# Patient Record
Sex: Female | Born: 1985 | Race: White | Hispanic: No | Marital: Single | Smoking: Never smoker
Health system: Southern US, Community
[De-identification: ages and names within clinical notes are randomized; demographics above are authoritative.]

## PROBLEM LIST (undated history)

## (undated) HISTORY — PX: CHOLECYSTECTOMY: SHX55

## (undated) HISTORY — PX: GASTRIC ROUX-EN-Y: SHX5262

## (undated) HISTORY — PX: TONSILLECTOMY: SHX5217

---
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
## 2007-02-05 IMAGING — RF DG CHOLANGIOGRAM OPERATIVE
1 series · 4 of 4 positions shown · non-contrast
Comparison: none

CLINICAL DATA: Cholelithiasis.  
 INTRAOPERATIVE CHOLANGIOGRAM:
TECHNIQUE: Multiple fluoroscopic spot radiographs were obtained during intraoperative cholangiogram, and are submitted for interpretation post-operatively.

[Series 1: run · 4 of 78 frames shown]
[frame 2/78]
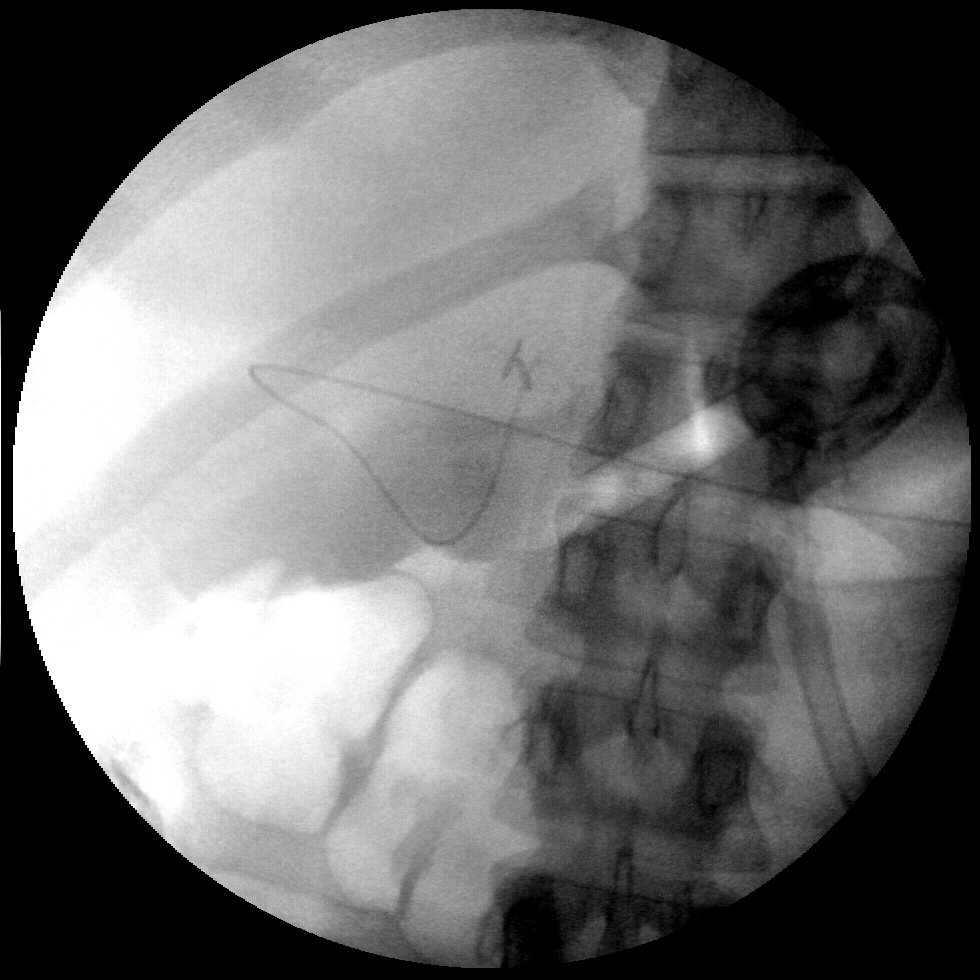
[frame 12/78]
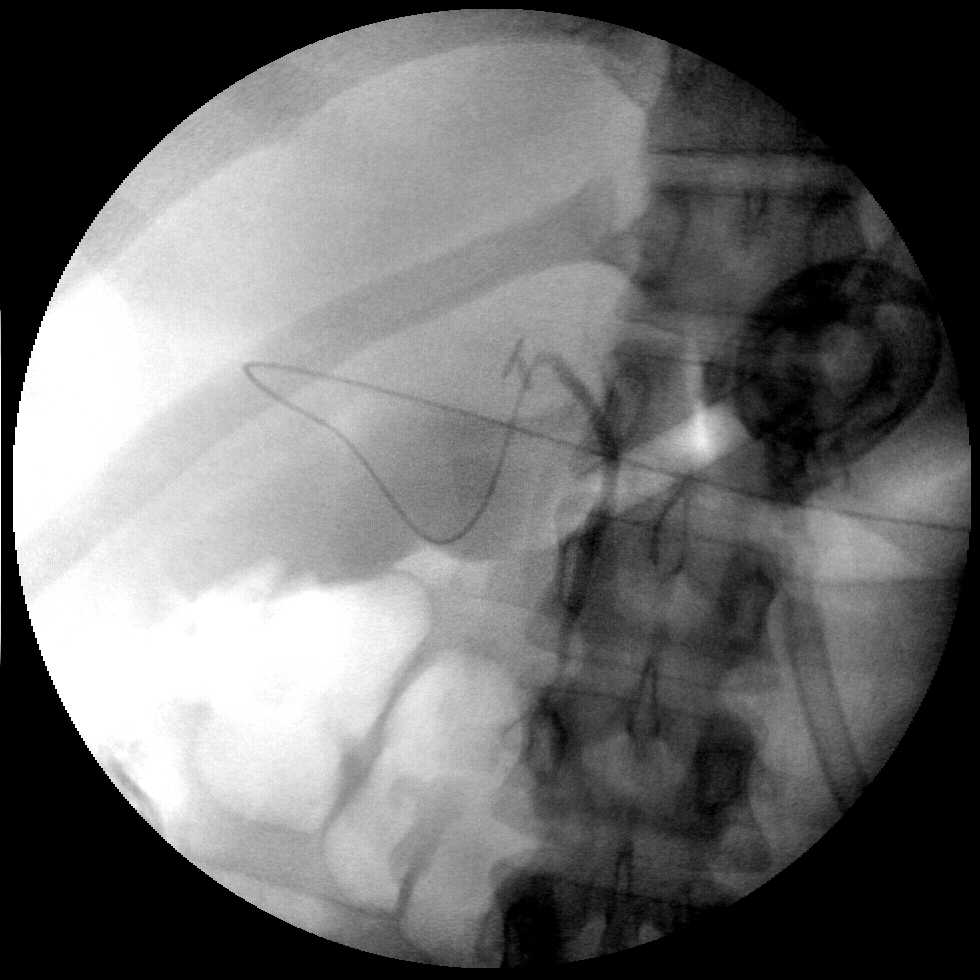
[frame 40/78]
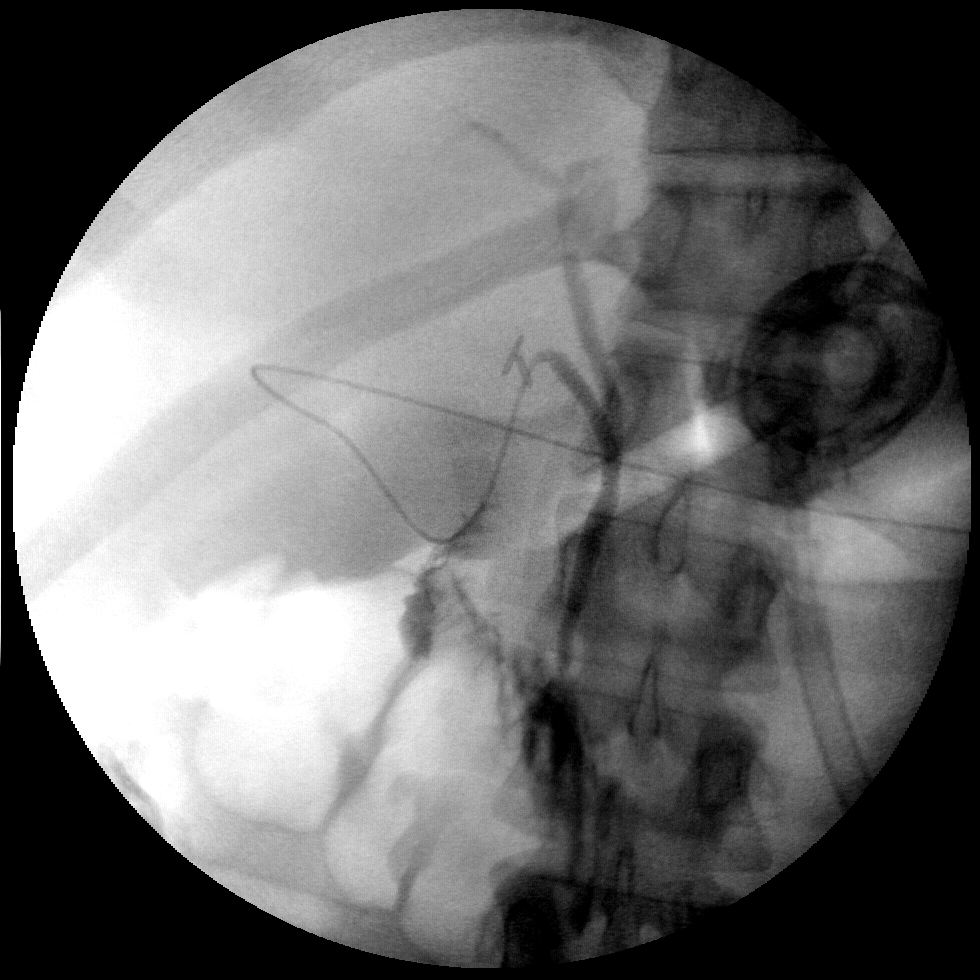
[frame 67/78]
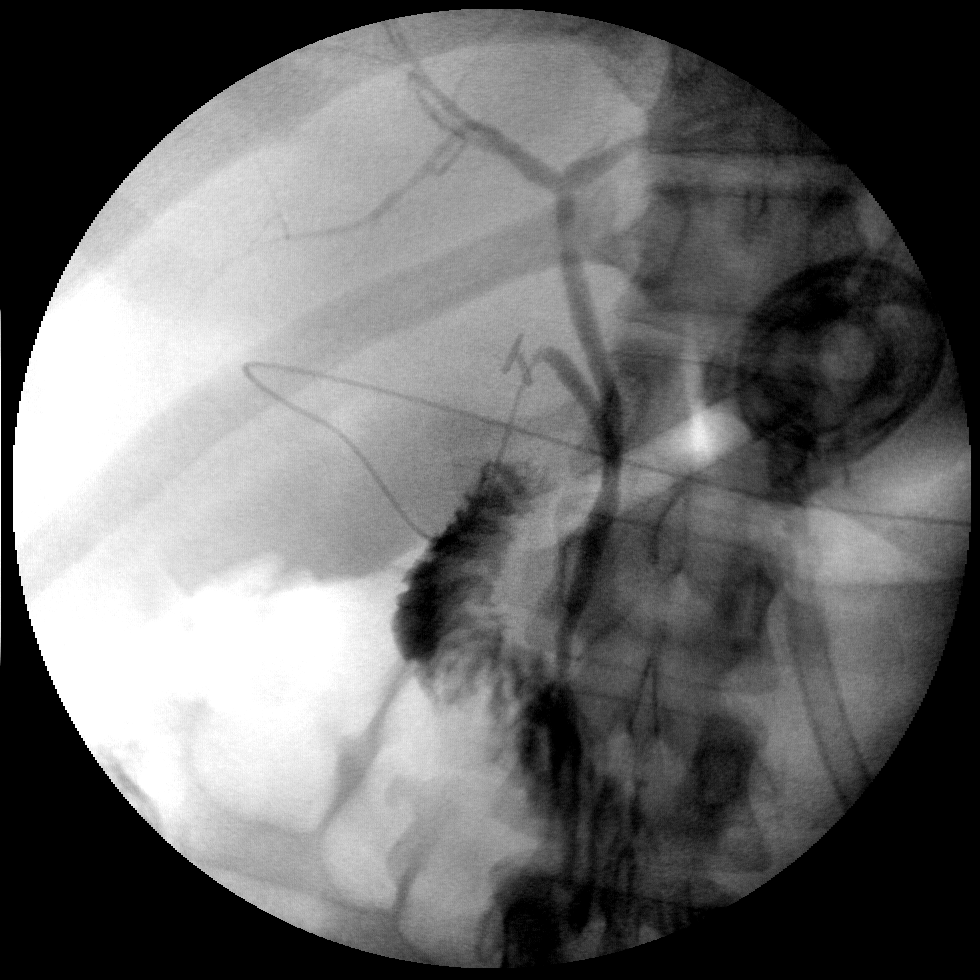

[4 of 4 positions shown; findings below may reference images not displayed]

FINDINGS: No calculi are identified within the common bile duct.  There is no evidence of biliary stricture or obstruction.
IMPRESSION: Negative intraoperative cholangiogram.

## 2007-03-03 ENCOUNTER — Other Ambulatory Visit: Admission: RE | Admit: 2007-03-03 | Discharge: 2007-03-03 | Payer: Self-pay | Admitting: Obstetrics and Gynecology

## 2010-10-03 NOTE — Op Note (Signed)
NAMEDARTHY, MANGANELLI NO.:  [id number]   MEDICAL RECORD NO.:  [id number]          PATIENT TYPE:  INP   LOCATION:  0098                         FACILITY:  [facility]   PHYSICIAN:  Angelia Mould. Derrell Lolling, M.D.DATE OF BIRTH:  1985/09/27   DATE OF PROCEDURE:  12/01/2005  DATE OF DISCHARGE:                                 OPERATIVE REPORT   PREOPERATIVE DIAGNOSIS:  Acute cholecystitis with cholelithiasis.   POSTOPERATIVE DIAGNOSIS:  Acute cholecystitis with cholelithiasis.   OPERATION PERFORMED:  Laparoscopic cholecystectomy with intraoperative  cholangiogram.   SURGEON:  Angelia Mould. Derrell Lolling, M.D.   FIRST ASSISTANT:  Manus Rudd, M.D.   OPERATIVE INDICATIONS:  This is a 25 year old white female who has had about  4 recent attacks of right upper quadrant pain.  She has had an ultrasound  which shows gallstones, but a normal common bile duct.  She was scheduled  have elective surgery later this month, but had a severe attack this morning  and came to the emergency room and was found to have fairly significant  right upper quadrant tenderness and a white blood cell count elevated to  15,000.  Liver function tests are normal.  She is coming in the hospital  today and was brought to operating room urgently for cholecystectomy.   OPERATIVE FINDINGS:  The patient had acute cholecystitis.  The gallbladder  was thick-walled, edematous and tense.  There was a large stone in the  infundibulum of the gallbladder.  The cystic duct was tiny.  The  cholangiogram was normal, showing normal intrahepatic and extrahepatic bile  ducts, no filling defects, no obstruction and good flow of contrast into the  duodenum.  The liver, stomach, duodenum, small intestine, large intestine  and peritoneal surfaces were otherwise normal.   OPERATIVE TECHNIQUE:  Following the induction of general endotracheal  anesthesia, the patient's abdomen was prepped and draped in a sterile  fashion.  Intravenous  antibiotics had been given preoperatively.  The  patient was identified.  0.5% Marcaine with epinephrine was used as a local-  infiltration anesthetic.  A vertically oriented incision was made inside the  lower rim of the umbilicus.  The fascia was incised in the midline and the  abdominal cavity entered under direct vision.  A 10-mm Hasson trocar was  inserted and secured with a pursestring suture of zero 0 Vicryl.  Pneumoperitoneum was created.  Video camera was inserted with visualization  and findings as described above.  A 10-mm trocar was placed in the  subxiphoid region and two 5-mm trocars placed in the right upper quadrant.  We could grab the gallbladder fundus and lift the gallbladder up.  We  stripped off some adhesions and then slowly dissected out the infundibulum  of the gallbladder, isolated the cystic duct and created a window behind the  cystic duct.  We inserted a cholangiogram catheter into the cystic duct.  A  cholangiogram was obtained using the C-arm.  The cholangiogram was normal,  as described above.  The cholangiogram catheter was removed.  The cystic  duct was secured with multiple metal clips and divided.  We  isolated the  cystic artery, secured it with multiple metal clips and divided it.  We  actually isolated an anterior and a posterior branch separately and  controlled these individually.  We then dissected the gallbladder from its  bed with electrocautery, placed it in a specimen bag and removed it.  During  and at the end of the case, we copiously irrigated the operative field.  Hemostasis was excellent and achieved with electrocautery.  At the  completion of the case, there was no bleeding and no bile leak.  The trocars  were removed under direct vision and there was no bleeding from the trocar  sites.  The pneumoperitoneum was released.  The fascia at the umbilicus was  closed with 0 Vicryl sutures.  Skin incision were closed with subcuticular  sutures of  4-0 Monocryl and Steri-Strips.  Clean bandages were placed and  the patient taken to the recovery room in stable condition.  Estimated blood  loss was about 20 mL.  Complications -- none.  Sponge, needle and instrument  counts were correct.      Angelia Mould. Derrell Lolling, M.D.  Electronically Signed     HMI/MEDQ  D:  12/01/2005  T:  12/02/2005  Job:  [id number]   cc:   Harrel Lemon. Merla Riches, M.D.  Fax: [phone]

## 2010-10-03 NOTE — H&P (Signed)
Alicia Lamb, Alicia Lamb NO.:  [id number]   MEDICAL RECORD NO.:  [id number]          PATIENT TYPE:  INP   LOCATION:  0103                         FACILITY:  [facility]   PHYSICIAN:  Angelia Mould. Derrell Lolling, M.D.DATE OF BIRTH:  1986/02/19   DATE OF ADMISSION:  12/01/2005  DATE OF DISCHARGE:                                HISTORY & PHYSICAL   CHIEF COMPLAINT:  Abdominal pain and gallstones.   HISTORY OF PRESENT ILLNESS:  This is a 25 year old white female who has had  3-4 attacks of right upper quadrant pain, nausea and vomiting over the past  few weeks.  She was evaluated by Dr. Ellamae Sia.  A gallbladder  ultrasound was obtained at [facility] and according to Dr. Leonie Man this shows gallstones but no intrahepatic ductal dilatation and the  common bile duct measures 4-mm in size.  There were no signs of any  inflammatory change around the gallbladder.   She saw Dr. Lurene Shadow in the office on November 09, 2005, and was scheduled for  surgery later this month.  She had a severe attack of pain, nausea and  vomiting this morning.  She called the office and Dr. Lurene Shadow directed her to  the [facility] emergency room.  She has received a dose of Dilaudid and  feels much better now, but says she still has a little bit of pain.   PAST HISTORY:  1.  Bipolar disorder, taking no medications.  2.  Bilateral bunionectomy  3.  Tonsillectomy.  4.  Mild gastroesophageal reflux disease.   CURRENT MEDICATIONS:  Prevacid, Yasmin estrogen supplement.   DRUG ALLERGIES:  NONE KNOWN.   FAMILY HISTORY:  Mother, father, and brother are living and well without  major medical problems.   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  Denies tobacco or alcohol.   REVIEW OF SYSTEMS:  A 15-system review of systems was performed.  It is  noncontributory except as described above.   PHYSICAL EXAMINATION:  GENERAL:  A  pleasant young woman in minimal distress.  She is somewhat overweight.  Her mother is without her throughout the  encounter.  VITAL SIGNS:  Temperature 98.1, pulse 70, respirations 16, blood pressure  122/62.  EYES:  Sclerae clear.  Extraocular movements intact.  Ears, nose, mouth,  throat, nose, lips, tongue, and oropharynx are without gross lesions.  NECK:  Supple., nontender.  No mass.  No jugular vein distension.  LUNGS:  Clear to auscultation.  No chest wall tenderness.  HEART:  Regular rate and rhythm.  No murmur.  Radial, femoral, and posterior  tibial pulses are palpable.  No peripheral edema.  BREAST  Not examined.  ABDOMEN:  Somewhat obese, soft, objectively not tender, but subjectively she  says it is uncomfortable to palpate the right upper quadrant.  She is not  distended.  There is no palpable mass.  No hernias are felt.  EXTREMITIES:  She moves all four extremities well without pain or deformity.  '  NEUROLOGIC:  No gross  motor or sensory deficits.   ADMISSION DATA:  White blood cell count 15,000, hemoglobin 13.6.  Liver  function tests normal.  Lipase pending.  Urine pregnancy test pending.   ASSESSMENT:  1.  Acute cholecystitis with cholelithiasis.  2.  Mild gastroesophageal reflux disease.  3.  History of bipolar disorder.   PLAN:  1.  The patient will be admitted and started on intravenous antibiotics with      plans to take her to the operating room within the next 8-24 hours for      cholecystectomy.  2.  I have discussed the indications and details of surgery with her.  Risks      and complications have been outlined, including but not limited to      bleeding, infection, conversion to open laparotomy, injury to adjacent      organs such as the main bile duct or intestine with major reconstructive      surgery, wound problems such as infection or hernia, cardiac, pulmonary      and thromboembolic problems.  Both she and her mother seem to understand       these issues well.  At this time all of their questions were answered.      She is in full agreement with this plan.      Angelia Mould. Derrell Lolling, M.D.  Electronically Signed     HMI/MEDQ  D:  12/01/2005  T:  12/01/2005  Job:  [id number]   cc:   Harrel Lemon. Merla Riches, M.D.  Fax: [phone]

## 2011-08-21 ENCOUNTER — Ambulatory Visit: Payer: PRIVATE HEALTH INSURANCE | Admitting: Family Medicine

## 2011-08-21 VITALS — BP 98/78 | HR 110 | Temp 98.4°F | Resp 16 | Ht 69.5 in | Wt 280.2 lb

## 2011-08-21 DIAGNOSIS — R1110 Vomiting, unspecified: Secondary | ICD-10-CM

## 2011-08-21 DIAGNOSIS — R112 Nausea with vomiting, unspecified: Secondary | ICD-10-CM

## 2011-08-21 DIAGNOSIS — R197 Diarrhea, unspecified: Secondary | ICD-10-CM

## 2011-08-21 DIAGNOSIS — E860 Dehydration: Secondary | ICD-10-CM

## 2011-08-21 LAB — POCT CBC
Granulocyte percent: 89.8 %G — AB (ref 37–80)
HCT, POC: 44.5 % (ref 37.7–47.9)
Hemoglobin: 15 g/dL (ref 12.2–16.2)
Lymph, poc: 0.6 (ref 0.6–3.4)
MCH, POC: 29.8 pg (ref 27–31.2)
MCHC: 33.7 g/dL (ref 31.8–35.4)
MCV: 88.5 fL (ref 80–97)
MID (cbc): 1 — AB (ref 0–0.9)
MPV: 10.5 fL (ref 0–99.8)
POC Granulocyte: 14.3 — AB (ref 2–6.9)
POC LYMPH PERCENT: 3.8 %L — AB (ref 10–50)
POC MID %: 6.4 %M (ref 0–12)
Platelet Count, POC: 256 10*3/uL (ref 142–424)
RBC: 5.03 M/uL (ref 4.04–5.48)
RDW, POC: 14.5 %
WBC: 15.9 10*3/uL — AB (ref 4.6–10.2)

## 2011-08-21 LAB — COMPREHENSIVE METABOLIC PANEL
ALT: 43 U/L — ABNORMAL HIGH (ref 0–35)
AST: 39 U/L — ABNORMAL HIGH (ref 0–37)
Albumin: 4.6 g/dL (ref 3.5–5.2)
Alkaline Phosphatase: 62 U/L (ref 39–117)
BUN: 16 mg/dL (ref 6–23)
CO2: 23 mEq/L (ref 19–32)
Calcium: 9.7 mg/dL (ref 8.4–10.5)
Chloride: 102 mEq/L (ref 96–112)
Creat: 1.01 mg/dL (ref 0.50–1.10)
Glucose, Bld: 111 mg/dL — ABNORMAL HIGH (ref 70–99)
Potassium: 4.7 mEq/L (ref 3.5–5.3)
Sodium: 137 mEq/L (ref 135–145)
Total Bilirubin: 1.2 mg/dL (ref 0.3–1.2)
Total Protein: 7.6 g/dL (ref 6.0–8.3)

## 2011-08-21 MED ORDER — ONDANSETRON 4 MG PO TBDP
4.0000 mg | ORAL_TABLET | Freq: Once | ORAL | Status: AC
  Administered 2011-08-21: 4 mg via ORAL

## 2011-08-21 MED ORDER — ONDANSETRON 8 MG PO TBDP: 8.0000 mg | ORAL_TABLET | Freq: Three times a day (TID) | ORAL | Status: AC | PRN

## 2011-08-21 NOTE — Patient Instructions (Signed)
Nausea and Vomiting  Nausea is a sick feeling that often comes before throwing up (vomiting). Vomiting is a reflex where stomach contents come out of your mouth. Vomiting can cause severe loss of body fluids (dehydration). Children and elderly adults can become dehydrated quickly, especially if they also have diarrhea. Nausea and vomiting are symptoms of a condition or disease. It is important to find the cause of your symptoms.  CAUSES    Direct irritation of the stomach lining. This irritation can result from increased acid production (gastroesophageal reflux disease), infection, food poisoning, taking certain medicines (such as nonsteroidal anti-inflammatory drugs), alcohol use, or tobacco use.   Signals from the brain.These signals could be caused by a headache, heat exposure, an inner ear disturbance, increased pressure in the brain from injury, infection, a tumor, or a concussion, pain, emotional stimulus, or metabolic problems.   An obstruction in the gastrointestinal tract (bowel obstruction).   Illnesses such as diabetes, hepatitis, gallbladder problems, appendicitis, kidney problems, cancer, sepsis, atypical symptoms of a heart attack, or eating disorders.   Medical treatments such as chemotherapy and radiation.   Receiving medicine that makes you sleep (general anesthetic) during surgery.  DIAGNOSIS  Your caregiver may ask for tests to be done if the problems do not improve after a few days. Tests may also be done if symptoms are severe or if the reason for the nausea and vomiting is not clear. Tests may include:   Urine tests.   Blood tests.   Stool tests.   Cultures (to look for evidence of infection).   X-rays or other imaging studies.  Test results can help your caregiver make decisions about treatment or the need for additional tests.  TREATMENT  You need to stay well hydrated. Drink frequently but in small amounts.You may wish to drink water, sports drinks, clear broth, or eat frozen  ice pops or gelatin dessert to help stay hydrated.When you eat, eating slowly may help prevent nausea.There are also some antinausea medicines that may help prevent nausea.  HOME CARE INSTRUCTIONS    Take all medicine as directed by your caregiver.   If you do not have an appetite, do not force yourself to eat. However, you must continue to drink fluids.   If you have an appetite, eat a normal diet unless your caregiver tells you differently.   Eat a variety of complex carbohydrates (rice, wheat, potatoes, bread), lean meats, yogurt, fruits, and vegetables.   Avoid high-fat foods because they are more difficult to digest.   Drink enough water and fluids to keep your urine clear or pale yellow.   If you are dehydrated, ask your caregiver for specific rehydration instructions. Signs of dehydration may include:   Severe thirst.   Dry lips and mouth.   Dizziness.   Dark urine.   Decreasing urine frequency and amount.   Confusion.   Rapid breathing or pulse.  SEEK IMMEDIATE MEDICAL CARE IF:    You have blood or brown flecks (like coffee grounds) in your vomit.   You have black or bloody stools.   You have a severe headache or stiff neck.   You are confused.   You have severe abdominal pain.   You have chest pain or trouble breathing.   You do not urinate at least once every 8 hours.   You develop cold or clammy skin.   You continue to vomit for longer than 24 to 48 hours.   You have a fever.  MAKE SURE YOU:      Understand these instructions.   Will watch your condition.   Will get help right away if you are not doing well or get worse.  Document Released: 05/04/2005 Document Revised: 04/23/2011 Document Reviewed: 10/01/2010  [employer] Patient Information 2012 [employer].

## 2011-08-21 NOTE — Progress Notes (Signed)
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 Who has been vomiting for over 12 hours with diarrhea.  (onset 9:15pm)  Last vomited at 9:15 am with diarrhea shortly thereafter.  Unable to tolerated clear liquids all night  PMHx:  Gallbladder removed 2007, bunion surgery 2004, tonsillectomy 1994  O:  Alert, tired, NAD Color:  No jaundice, pale Chest:  Clear Heart:  Rapid, reg without murmur Abd: soft, active bs, no HSM, nontender. Results for orders placed in visit on 08/21/11  POCT CBC      Component Value Range   WBC 15.9 (*) 4.6 - 10.2 (K/uL)   Lymph, poc 0.6  0.6 - 3.4    POC LYMPH PERCENT 3.8 (*) 10 - 50 (%L)   MID (cbc) 1.0 (*) 0 - 0.9    POC MID % 6.4  0 - 12 (%M)   POC Granulocyte 14.3 (*) 2 - 6.9    Granulocyte percent 89.8 (*) 37 - 80 (%G)   RBC 5.03  4.04 - 5.48 (M/uL)   Hemoglobin 15.0  12.2 - 16.2 (g/dL)   HCT, POC 16.1  09.6 - 47.9 (%)   MCV 88.5  80 - 97 (fL)   MCH, POC 29.8  27 - 31.2 (pg)   MCHC 33.7  31.8 - 35.4 (g/dL)   RDW, POC 04.5     Platelet Count, POC 256  142 - 424 (K/uL)   MPV 10.5  0 - 99.8 (fL)     A:  Dehydrated with viral gastroenteritis syndrome  P:  Recheck 24 hours Clear liquids, Zofran

## 2011-08-22 ENCOUNTER — Ambulatory Visit (INDEPENDENT_AMBULATORY_CARE_PROVIDER_SITE_OTHER): Payer: PRIVATE HEALTH INSURANCE | Admitting: Family Medicine

## 2011-08-22 VITALS — BP 119/77 | HR 76 | Temp 98.0°F | Resp 18 | Ht 69.0 in | Wt 289.0 lb

## 2011-08-22 DIAGNOSIS — R197 Diarrhea, unspecified: Secondary | ICD-10-CM

## 2011-08-22 NOTE — Progress Notes (Signed)
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 Now has diarrhea most recently one half hours prior to arrival today. No abdominal pain is present and patient stopped vomiting.  Objective: HEENT unremarkable  Chest: Clear  Heart: Regular no murmur  Abdomen soft nontender with active but not hyperactive bowel sounds  Skin: Warm and dry  Assessment: Improving   Plan: Continue Zofran, clear liquids, probiotics

## 2012-11-12 ENCOUNTER — Ambulatory Visit: Payer: BC Managed Care – PPO | Admitting: Family Medicine

## 2012-11-12 VITALS — BP 116/80 | HR 97 | Temp 98.9°F | Resp 16 | Ht 70.0 in | Wt 293.6 lb

## 2012-11-12 DIAGNOSIS — E869 Volume depletion, unspecified: Secondary | ICD-10-CM

## 2012-11-12 DIAGNOSIS — A084 Viral intestinal infection, unspecified: Secondary | ICD-10-CM

## 2012-11-12 DIAGNOSIS — A088 Other specified intestinal infections: Secondary | ICD-10-CM

## 2012-11-12 DIAGNOSIS — R197 Diarrhea, unspecified: Secondary | ICD-10-CM

## 2012-11-12 DIAGNOSIS — R112 Nausea with vomiting, unspecified: Secondary | ICD-10-CM

## 2012-11-12 MED ORDER — ONDANSETRON 4 MG PO TBDP
4.0000 mg | ORAL_TABLET | Freq: Once | ORAL | Status: AC
  Administered 2012-11-12: 4 mg via ORAL

## 2012-11-12 MED ORDER — ONDANSETRON 4 MG PO TBDP: 4.0000 mg | ORAL_TABLET | Freq: Three times a day (TID) | ORAL | Status: DC | PRN

## 2012-11-12 NOTE — Progress Notes (Signed)
Subjective:    Patient ID: Alicia Lamb, female    DOB: April 27, 1986, 27 y.o.   MRN: [id number]  HPI Alicia Lamb is a 27 y.o. female  Woke up at 3 am - n/v and diarrhea.  Vomited 7 times, then dry heaving, diarrhea 9 times. Trouble keeping water down.  Heart racing, lightheaded with standing this am. Small amt uop 2 times his am.    Tx: tums,  SH: non known sick contact, nonsmoker. No alcohol.   Exercise phys, cardiac rehab at [facility].   History reviewed. No pertinent past medical history. Past Surgical History  Procedure Laterality Date  . Cholecystectomy    . Tonsillectomy  1994 or 95   No Known Allergies Prior to Admission medications   Not on File   History   Social History  . Marital Status: Single    Spouse Name: N/A    Number of Children: N/A  . Years of Education: N/A   Occupational History  . Not on file.   Social History Main Topics  . Smoking status: Never Smoker   . Smokeless tobacco: Not on file  . Alcohol Use: No  . Drug Use: No  . Sexually Active: Not Currently   Other Topics Concern  . Not on file   Social History Narrative  . No narrative on file    Review of Systems  Constitutional: Positive for fatigue. Negative for fever and chills.  Respiratory: Negative for shortness of breath.   Gastrointestinal: Positive for nausea, vomiting and diarrhea. Negative for blood in stool and abdominal distention.  Genitourinary: Positive for decreased urine volume. Negative for dysuria, frequency, hematuria and difficulty urinating.       Objective:   Physical Exam  Vitals reviewed. Constitutional: She is oriented to person, place, and time. She appears well-developed and well-nourished. No distress.  HENT:  Head: Normocephalic and atraumatic.  Right Ear: Hearing and external ear normal.  Left Ear: Hearing and external ear normal.  Nose: Nose normal.  Mouth/Throat: Oropharynx is clear and moist. Mucous membranes are dry. No oropharyngeal  exudate.  Eyes: Conjunctivae and EOM are normal. Pupils are equal, round, and reactive to light.  Cardiovascular: Regular rhythm, normal heart sounds, intact distal pulses and normal pulses.   No extrasystoles are present. Tachycardia present.   No murmur heard. Pulmonary/Chest: Effort normal and breath sounds normal. No respiratory distress. She has no wheezes. She has no rhonchi.  Abdominal: Soft. Bowel sounds are increased. There is no hepatosplenomegaly. There is tenderness in the epigastric area. There is no rigidity, no rebound, no guarding and no CVA tenderness.    Neurological: She is alert and oriented to person, place, and time.  Skin: Skin is warm and dry. No rash noted.  Psychiatric: She has a normal mood and affect. Her behavior is normal.    IV placed - 2 liters NS, Zofran 4mg  ODT - repeated once.   orthostatics reviewed, no emesis in office. feels a little better.      Assessment & Plan:  Alicia Lamb is a 27 y.o. female N&V (nausea and vomiting) - Plan: ondansetron (ZOFRAN-ODT) disintegrating tablet 4 mg, ondansetron (ZOFRAN-ODT) disintegrating tablet 4 mg  Diarrhea  Volume depletion  Suspected viral GE, with abd cramping.   volume depleted - but improved with IVF. Zofran Rx for home, ORT discussed, RTC,ER precautions discussed.   Meds ordered this encounter  Medications  . ondansetron (ZOFRAN-ODT) disintegrating tablet 4 mg    Sig:   . ondansetron (ZOFRAN-ODT) disintegrating tablet  4 mg    Sig:   . ondansetron (ZOFRAN ODT) 4 MG disintegrating tablet    Sig: Take 1 tablet (4 mg total) by mouth every 8 (eight) hours as needed for nausea.    Dispense:  10 tablet    Refill:  0   Patient Instructions  Zofran if needed, small sips of fluids as discussed, recheck if not improving next few days. Return to the clinic or go to the nearest emergency room if any of your symptoms worsen or new symptoms occur.   Gastroenteritis:  Diarrhea Infections caused by germs  (bacterial) or a virus commonly cause diarrhea. Your caregiver has determined that with time, rest and fluids, the diarrhea should improve. In general, eat normally while drinking more water than usual. Although water may prevent dehydration, it does not contain salt and minerals (electrolytes). Broths, weak tea without caffeine and oral rehydration solutions (ORS) replace fluids and electrolytes. Small amounts of fluids should be taken frequently. Large amounts at one time may not be tolerated. Plain water may be harmful in infants and the elderly. Oral rehydrating solutions (ORS) are available at pharmacies and grocery stores. ORS replace water and important electrolytes in proper proportions. Sports drinks are not as effective as ORS and may be harmful due to sugars worsening diarrhea.  ORS is especially recommended for use in children with diarrhea. As a general guideline for children, replace any new fluid losses from diarrhea and/or vomiting with ORS as follows:   If your child weighs 22 pounds or under (10 kg or less), give 60-120 mL ( -  cup or 2 - 4 ounces) of ORS for each episode of diarrheal stool or vomiting episode.   If your child weighs more than 22 pounds (more than 10 kgs), give 120-240 mL ( - 1 cup or 4 - 8 ounces) of ORS for each diarrheal stool or episode of vomiting.   While correcting for dehydration, children should eat normally. However, foods high in sugar should be avoided because this may worsen diarrhea. Large amounts of carbonated soft drinks, juice, gelatin desserts and other highly sugared drinks should be avoided.   After correction of dehydration, other liquids that are appealing to the child may be added. Children should drink small amounts of fluids frequently and fluids should be increased as tolerated. Children should drink enough fluids to keep urine clear or pale yellow.   Adults should eat normally while drinking more fluids than usual. Drink small amounts of  fluids frequently and increase as tolerated. Drink enough fluids to keep urine clear or pale yellow. Broths, weak decaffeinated tea, lemon lime soft drinks (allowed to go flat) and ORS replace fluids and electrolytes.   Avoid:   Carbonated drinks.   Juice.   Extremely hot or cold fluids.   Caffeine drinks.   Fatty, greasy foods.   Alcohol.   Tobacco.   Too much intake of anything at one time.   Gelatin desserts.   Probiotics are active cultures of beneficial bacteria. They may lessen the amount and number of diarrheal stools in adults. Probiotics can be found in yogurt with active cultures and in supplements.   Wash hands well to avoid spreading bacteria and virus.   Anti-diarrheal medications are not recommended for infants and children.   Only take over-the-counter or prescription medicines for pain, discomfort or fever as directed by your caregiver. Do not give aspirin to children because it may cause Reye's Syndrome.   For adults, ask your caregiver if  you should continue all prescribed and over-the-counter medicines.   If your caregiver has given you a follow-up appointment, it is very important to keep that appointment. Not keeping the appointment could result in a chronic or permanent injury, and disability. If there is any problem keeping the appointment, you must call back to this facility for assistance.  SEEK IMMEDIATE MEDICAL CARE IF:   You or your child is unable to keep fluids down or other symptoms or problems become worse in spite of treatment.   Vomiting or diarrhea develops and becomes persistent.   There is vomiting of blood or bile (green material).   There is blood in the stool or the stools are black and tarry.   There is no urine output in 6-8 hours or there is only a small amount of very dark urine.   Abdominal pain develops, increases or localizes.   You have a fever.   Your baby is older than 3 months with a rectal temperature of 102 F  (38.9 C) or higher.   Your baby is 79 months old or younger with a rectal temperature of 100.4 F (38 C) or higher.   You or your child develops excessive weakness, dizziness, fainting or extreme thirst.   You or your child develops a rash, stiff neck, severe headache or become irritable or sleepy and difficult to awaken.  MAKE SURE YOU:   Understand these instructions.   Will watch your condition.   Will get help right away if you are not doing well or get worse.  Document Released: 04/24/2002 Document Revised: 04/23/2011 Document Reviewed: 03/11/2009 [facility] Patient Information 2012 [place], [place].  Nausea and Vomiting Nausea is a sick feeling that often comes before throwing up (vomiting). Vomiting is a reflex where stomach contents come out of your mouth. Vomiting can cause severe loss of body fluids (dehydration). Children and elderly adults can become dehydrated quickly, especially if they also have diarrhea. Nausea and vomiting are symptoms of a condition or disease. It is important to find the cause of your symptoms. CAUSES   Direct irritation of the stomach lining. This irritation can result from increased acid production (gastroesophageal reflux disease), infection, food poisoning, taking certain medicines (such as nonsteroidal anti-inflammatory drugs), alcohol use, or tobacco use.   Signals from the brain.These signals could be caused by a headache, heat exposure, an inner ear disturbance, increased pressure in the brain from injury, infection, a tumor, or a concussion, pain, emotional stimulus, or metabolic problems.   An obstruction in the gastrointestinal tract (bowel obstruction).   Illnesses such as diabetes, hepatitis, gallbladder problems, appendicitis, kidney problems, cancer, sepsis, atypical symptoms of a heart attack, or eating disorders.   Medical treatments such as chemotherapy and radiation.   Receiving medicine that makes you sleep (general anesthetic)  during surgery.  DIAGNOSIS Your caregiver may ask for tests to be done if the problems do not improve after a few days. Tests may also be done if symptoms are severe or if the reason for the nausea and vomiting is not clear. Tests may include:  Urine tests.   Blood tests.   Stool tests.   Cultures (to look for evidence of infection).   X-rays or other imaging studies.  Test results can help your caregiver make decisions about treatment or the need for additional tests. TREATMENT You need to stay well hydrated. Drink frequently but in small amounts.You may wish to drink water, sports drinks, clear broth, or eat frozen ice pops or gelatin  dessert to help stay hydrated.When you eat, eating slowly may help prevent nausea.There are also some antinausea medicines that may help prevent nausea. HOME CARE INSTRUCTIONS   Take all medicine as directed by your caregiver.   If you do not have an appetite, do not force yourself to eat. However, you must continue to drink fluids.   If you have an appetite, eat a normal diet unless your caregiver tells you differently.   Eat a variety of complex carbohydrates (rice, wheat, potatoes, bread), lean meats, yogurt, fruits, and vegetables.   Avoid high-fat foods because they are more difficult to digest.   Drink enough water and fluids to keep your urine clear or pale yellow.   If you are dehydrated, ask your caregiver for specific rehydration instructions. Signs of dehydration may include:   Severe thirst.   Dry lips and mouth.   Dizziness.   Dark urine.   Decreasing urine frequency and amount.   Confusion.   Rapid breathing or pulse.  SEEK IMMEDIATE MEDICAL CARE IF:   You have blood or brown flecks (like coffee grounds) in your vomit.   You have black or bloody stools.   You have a severe headache or stiff neck.   You are confused.   You have severe abdominal pain.   You have chest pain or trouble breathing.   You do not  urinate at least once every 8 hours.   You develop cold or clammy skin.   You continue to vomit for longer than 24 to 48 hours.   You have a fever.  MAKE SURE YOU:   Understand these instructions.   Will watch your condition.   Will get help right away if you are not doing well or get worse.  Document Released: 05/04/2005 Document Revised: 04/23/2011 Document Reviewed: 10/01/2010 [facility] Patient Information 2012 [place], [place].  Return to the clinic or go to the nearest emergency room if any of your symptoms worsen or new symptoms occur.

## 2012-11-12 NOTE — Patient Instructions (Addendum)
Zofran if needed, small sips of fluids as discussed, recheck if not improving next few days. Return to the clinic or go to the nearest emergency room if any of your symptoms worsen or new symptoms occur.   Gastroenteritis:  Diarrhea Infections caused by germs (bacterial) or a virus commonly cause diarrhea. Your caregiver has determined that with time, rest and fluids, the diarrhea should improve. In general, eat normally while drinking more water than usual. Although water may prevent dehydration, it does not contain salt and minerals (electrolytes). Broths, weak tea without caffeine and oral rehydration solutions (ORS) replace fluids and electrolytes. Small amounts of fluids should be taken frequently. Large amounts at one time may not be tolerated. Plain water may be harmful in infants and the elderly. Oral rehydrating solutions (ORS) are available at pharmacies and grocery stores. ORS replace water and important electrolytes in proper proportions. Sports drinks are not as effective as ORS and may be harmful due to sugars worsening diarrhea.  ORS is especially recommended for use in children with diarrhea. As a general guideline for children, replace any new fluid losses from diarrhea and/or vomiting with ORS as follows:   If your child weighs 22 pounds or under (10 kg or less), give 60-120 mL ( -  cup or 2 - 4 ounces) of ORS for each episode of diarrheal stool or vomiting episode.   If your child weighs more than 22 pounds (more than 10 kgs), give 120-240 mL ( - 1 cup or 4 - 8 ounces) of ORS for each diarrheal stool or episode of vomiting.   While correcting for dehydration, children should eat normally. However, foods high in sugar should be avoided because this may worsen diarrhea. Large amounts of carbonated soft drinks, juice, gelatin desserts and other highly sugared drinks should be avoided.   After correction of dehydration, other liquids that are appealing to the child may be added.  Children should drink small amounts of fluids frequently and fluids should be increased as tolerated. Children should drink enough fluids to keep urine clear or pale yellow.   Adults should eat normally while drinking more fluids than usual. Drink small amounts of fluids frequently and increase as tolerated. Drink enough fluids to keep urine clear or pale yellow. Broths, weak decaffeinated tea, lemon lime soft drinks (allowed to go flat) and ORS replace fluids and electrolytes.   Avoid:   Carbonated drinks.   Juice.   Extremely hot or cold fluids.   Caffeine drinks.   Fatty, greasy foods.   Alcohol.   Tobacco.   Too much intake of anything at one time.   Gelatin desserts.   Probiotics are active cultures of beneficial bacteria. They may lessen the amount and number of diarrheal stools in adults. Probiotics can be found in yogurt with active cultures and in supplements.   Wash hands well to avoid spreading bacteria and virus.   Anti-diarrheal medications are not recommended for infants and children.   Only take over-the-counter or prescription medicines for pain, discomfort or fever as directed by your caregiver. Do not give aspirin to children because it may cause Reye's Syndrome.   For adults, ask your caregiver if you should continue all prescribed and over-the-counter medicines.   If your caregiver has given you a follow-up appointment, it is very important to keep that appointment. Not keeping the appointment could result in a chronic or permanent injury, and disability. If there is any problem keeping the appointment, you must call back to this  facility for assistance.  SEEK IMMEDIATE MEDICAL CARE IF:   You or your child is unable to keep fluids down or other symptoms or problems become worse in spite of treatment.   Vomiting or diarrhea develops and becomes persistent.   There is vomiting of blood or bile (green material).   There is blood in the stool or the stools  are black and tarry.   There is no urine output in 6-8 hours or there is only a small amount of very dark urine.   Abdominal pain develops, increases or localizes.   You have a fever.   Your baby is older than 3 months with a rectal temperature of 102 F (38.9 C) or higher.   Your baby is 74 months old or younger with a rectal temperature of 100.4 F (38 C) or higher.   You or your child develops excessive weakness, dizziness, fainting or extreme thirst.   You or your child develops a rash, stiff neck, severe headache or become irritable or sleepy and difficult to awaken.  MAKE SURE YOU:   Understand these instructions.   Will watch your condition.   Will get help right away if you are not doing well or get worse.  Document Released: 04/24/2002 Document Revised: 04/23/2011 Document Reviewed: 03/11/2009 [facility] Patient Information 2012 [place], [place].  Nausea and Vomiting Nausea is a sick feeling that often comes before throwing up (vomiting). Vomiting is a reflex where stomach contents come out of your mouth. Vomiting can cause severe loss of body fluids (dehydration). Children and elderly adults can become dehydrated quickly, especially if they also have diarrhea. Nausea and vomiting are symptoms of a condition or disease. It is important to find the cause of your symptoms. CAUSES   Direct irritation of the stomach lining. This irritation can result from increased acid production (gastroesophageal reflux disease), infection, food poisoning, taking certain medicines (such as nonsteroidal anti-inflammatory drugs), alcohol use, or tobacco use.   Signals from the brain.These signals could be caused by a headache, heat exposure, an inner ear disturbance, increased pressure in the brain from injury, infection, a tumor, or a concussion, pain, emotional stimulus, or metabolic problems.   An obstruction in the gastrointestinal tract (bowel obstruction).   Illnesses such as diabetes,  hepatitis, gallbladder problems, appendicitis, kidney problems, cancer, sepsis, atypical symptoms of a heart attack, or eating disorders.   Medical treatments such as chemotherapy and radiation.   Receiving medicine that makes you sleep (general anesthetic) during surgery.  DIAGNOSIS Your caregiver may ask for tests to be done if the problems do not improve after a few days. Tests may also be done if symptoms are severe or if the reason for the nausea and vomiting is not clear. Tests may include:  Urine tests.   Blood tests.   Stool tests.   Cultures (to look for evidence of infection).   X-rays or other imaging studies.  Test results can help your caregiver make decisions about treatment or the need for additional tests. TREATMENT You need to stay well hydrated. Drink frequently but in small amounts.You may wish to drink water, sports drinks, clear broth, or eat frozen ice pops or gelatin dessert to help stay hydrated.When you eat, eating slowly may help prevent nausea.There are also some antinausea medicines that may help prevent nausea. HOME CARE INSTRUCTIONS   Take all medicine as directed by your caregiver.   If you do not have an appetite, do not force yourself to eat. However, you must continue  to drink fluids.   If you have an appetite, eat a normal diet unless your caregiver tells you differently.   Eat a variety of complex carbohydrates (rice, wheat, potatoes, bread), lean meats, yogurt, fruits, and vegetables.   Avoid high-fat foods because they are more difficult to digest.   Drink enough water and fluids to keep your urine clear or pale yellow.   If you are dehydrated, ask your caregiver for specific rehydration instructions. Signs of dehydration may include:   Severe thirst.   Dry lips and mouth.   Dizziness.   Dark urine.   Decreasing urine frequency and amount.   Confusion.   Rapid breathing or pulse.  SEEK IMMEDIATE MEDICAL CARE IF:   You have  blood or brown flecks (like coffee grounds) in your vomit.   You have black or bloody stools.   You have a severe headache or stiff neck.   You are confused.   You have severe abdominal pain.   You have chest pain or trouble breathing.   You do not urinate at least once every 8 hours.   You develop cold or clammy skin.   You continue to vomit for longer than 24 to 48 hours.   You have a fever.  MAKE SURE YOU:   Understand these instructions.   Will watch your condition.   Will get help right away if you are not doing well or get worse.  Document Released: 05/04/2005 Document Revised: 04/23/2011 Document Reviewed: 10/01/2010 [facility] Patient Information 2012 [place], [place].  Return to the clinic or go to the nearest emergency room if any of your symptoms worsen or new symptoms occur.

## 2013-12-09 ENCOUNTER — Ambulatory Visit (INDEPENDENT_AMBULATORY_CARE_PROVIDER_SITE_OTHER): Payer: BC Managed Care – PPO | Admitting: Family Medicine

## 2013-12-09 VITALS — BP 112/70 | HR 72 | Temp 98.2°F | Resp 20 | Ht 68.7 in | Wt 284.5 lb

## 2013-12-09 DIAGNOSIS — W57XXXA Bitten or stung by nonvenomous insect and other nonvenomous arthropods, initial encounter: Secondary | ICD-10-CM

## 2013-12-09 DIAGNOSIS — T148 Other injury of unspecified body region: Secondary | ICD-10-CM

## 2013-12-09 DIAGNOSIS — IMO0002 Reserved for concepts with insufficient information to code with codable children: Secondary | ICD-10-CM

## 2013-12-09 MED ORDER — AMOXICILLIN-POT CLAVULANATE 875-125 MG PO TABS: 1.0000 | ORAL_TABLET | Freq: Two times a day (BID) | ORAL | Status: AC

## 2013-12-09 NOTE — Patient Instructions (Signed)

## 2013-12-09 NOTE — Progress Notes (Signed)
   Subjective:    Patient ID: Alicia Lamb, female    DOB: 1986/01/31, 28 y.o.   MRN: [id number] This chart was scribed for Val RilesKurt Lauensetein, MD by Leona CarryG. Clay Sherrill, ED Scribe. The patient was seen in Room 10. The patient's care was started at 3:28 PM.    HPI HPI Comments: Alicia Lamb is a 28 y.o. female who presents complaining of an insect bite on her right forearm that occurred two days ago while she was on a walk. Patient reports that she saw a bee, but did not find a stinger the bite. Patient noted redness in the area surrounding the bite. She reports that the area of redness has expanded since the incident.  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 Patient does not have a PCP.  Review of Systems  Skin:       Insect bite.       Objective:   Physical Exam  Nursing note and vitals reviewed. Constitutional: She is oriented to person, place, and time. She appears well-developed and well-nourished. No distress.  HENT:  Head: Normocephalic and atraumatic.  Eyes: Conjunctivae and EOM are normal.  Neck: Neck supple. No tracheal deviation present.  Cardiovascular: Normal rate.   Pulmonary/Chest: Effort normal. No respiratory distress.  Musculoskeletal: Normal range of motion.  Neurological: She is alert and oriented to person, place, and time.  Skin: Skin is warm and dry.  Patient has erythema on her right forearm dorsally. She has a series of concentric annular markings showing the progression of cellulitis of the right arm. There is central vesiculation as well.  She has good range of motion of her elbow, wrist, and hand.  Psychiatric: She has a normal mood and affect. Her behavior is normal.       Assessment & Plan:  This chart was scribed in my presence and reviewed by me personally.

## 2013-12-11 ENCOUNTER — Ambulatory Visit (INDEPENDENT_AMBULATORY_CARE_PROVIDER_SITE_OTHER): Payer: BC Managed Care – PPO | Admitting: Physician Assistant

## 2013-12-11 VITALS — BP 124/70 | HR 79 | Temp 98.2°F | Resp 18 | Ht 70.0 in | Wt 285.0 lb

## 2013-12-11 DIAGNOSIS — IMO0002 Reserved for concepts with insufficient information to code with codable children: Secondary | ICD-10-CM

## 2013-12-11 NOTE — Progress Notes (Signed)
   Subjective:    Patient ID: Alicia OpitzLieryn Lamb, female    DOB: 23-Apr-1986, 28 y.o.   MRN: [id number]  HPI 28 year old female presents for recheck of right arm. Had insect sting on 7/23 that subsequently developed into an erythematous area on her forearm that was progressively worsening. Initially seen here on 7/25 and placed on augmentin which she is tolerating well. Is here because she is worried that the erythema has extended proximally and she still has decreased ROM of her wrist. Admits that the pain has improved and she does think that the erythema has decreased overall.  Describes as somewhat pruritic. She has not taken any antihistamines since onset.   Denies fever, chills, nausea, vomiting, or headache.     Review of Systems  Constitutional: Negative for fever and chills.  Gastrointestinal: Negative for nausea.  Musculoskeletal: Positive for joint swelling.  Skin: Positive for color change.  Neurological: Negative for headaches.       Objective:   Physical Exam  Constitutional: She is oriented to person, place, and time. She appears well-developed and well-nourished.  HENT:  Head: Normocephalic and atraumatic.  Right Ear: External ear normal.  Left Ear: External ear normal.  Eyes: Conjunctivae are normal.  Neck: Normal range of motion.  Cardiovascular: Normal rate.   Pulmonary/Chest: Effort normal.  Neurological: She is alert and oriented to person, place, and time.  Skin:     Psychiatric: She has a normal mood and affect. Her behavior is normal. Judgment and thought content normal.          Assessment & Plan:  Cellulitis and abscess of upper arm and forearm  Cellulitis of right forearm - improving.  Erythema seems to be decreasing in size. Proximal area likely due to elevating arm.  Recommend she continue augmentin as directed. Start zyrtec daily in the morning and benadryl at bedtime.  Continue elevating. RTC precautions discussed Follow up if symptoms worsening  or fail to improve.

## 2014-03-02 ENCOUNTER — Other Ambulatory Visit: Payer: Self-pay

## 2018-04-18 ENCOUNTER — Emergency Department (HOSPITAL_COMMUNITY): Payer: BLUE CROSS/BLUE SHIELD

## 2018-04-18 ENCOUNTER — Other Ambulatory Visit: Payer: Self-pay

## 2018-04-18 ENCOUNTER — Encounter (HOSPITAL_COMMUNITY): Payer: Self-pay | Admitting: Family Medicine

## 2018-04-18 ENCOUNTER — Emergency Department (HOSPITAL_COMMUNITY)
Admission: EM | Admit: 2018-04-18 | Discharge: 2018-04-18 | Disposition: A | Discharge status: Home / Self Care | Payer: BLUE CROSS/BLUE SHIELD | Attending: Emergency Medicine | Admitting: Emergency Medicine

## 2018-04-18 DIAGNOSIS — S42202A Unspecified fracture of upper end of left humerus, initial encounter for closed fracture: Secondary | ICD-10-CM | POA: Diagnosis not present

## 2018-04-18 DIAGNOSIS — Y999 Unspecified external cause status: Secondary | ICD-10-CM | POA: Insufficient documentation

## 2018-04-18 DIAGNOSIS — Z79899 Other long term (current) drug therapy: Secondary | ICD-10-CM | POA: Insufficient documentation

## 2018-04-18 DIAGNOSIS — S4992XA Unspecified injury of left shoulder and upper arm, initial encounter: Secondary | ICD-10-CM | POA: Diagnosis present

## 2018-04-18 DIAGNOSIS — Y92480 Sidewalk as the place of occurrence of the external cause: Secondary | ICD-10-CM | POA: Diagnosis not present

## 2018-04-18 DIAGNOSIS — Y9301 Activity, walking, marching and hiking: Secondary | ICD-10-CM | POA: Diagnosis not present

## 2018-04-18 DIAGNOSIS — W010XXA Fall on same level from slipping, tripping and stumbling without subsequent striking against object, initial encounter: Secondary | ICD-10-CM | POA: Diagnosis not present

## 2018-04-18 MED ORDER — ONDANSETRON HCL 4 MG/2ML IJ SOLN
4.0000 mg | Freq: Once | INTRAMUSCULAR | Status: AC
  Administered 2018-04-18: 4 mg via INTRAVENOUS
  Filled 2018-04-18: qty 2

## 2018-04-18 MED ORDER — SODIUM CHLORIDE 0.9 % IV BOLUS
500.0000 mL | Freq: Once | INTRAVENOUS | Status: AC
  Administered 2018-04-18: 500 mL via INTRAVENOUS

## 2018-04-18 MED ORDER — OXYCODONE-ACETAMINOPHEN 5-325 MG PO TABS: 1.0000 | ORAL_TABLET | ORAL | 0 refills | Status: AC | PRN

## 2018-04-18 MED ORDER — MORPHINE SULFATE (PF) 4 MG/ML IV SOLN
4.0000 mg | Freq: Once | INTRAVENOUS | Status: AC
  Administered 2018-04-18: 4 mg via INTRAVENOUS
  Filled 2018-04-18: qty 1

## 2018-04-18 MED ORDER — ONDANSETRON HCL 4 MG PO TABS: 4.0000 mg | ORAL_TABLET | Freq: Four times a day (QID) | ORAL | 0 refills | Status: AC

## 2018-04-18 NOTE — Discharge Instructions (Addendum)
Sling, ice, follow-up with orthopedic surgeon.  Phone number given.  Prescription for pain and nausea medicine.  I was unable to send the narcotic electronically.

## 2018-04-18 NOTE — ED Triage Notes (Signed)
Patient was walking downtown when she tripped on the sidewalk. Patient landed on her left arm. Denies any LOC. [facility] Enroute an IV was obtained and FENTANYL 150mcg was given for pain. Left arm is splinted but they deny any obvious deformity.

## 2018-04-19 NOTE — ED Provider Notes (Signed)
[facility]-EMERGENCY DEPT Provider Note   CSN: [id number] Arrival date & time: 04/18/18  1738     History   Chief Complaint Chief Complaint  Patient presents with  . Fall  . Arm Pain    HPI Alicia Lamb is a 32 y.o. female.  Level 5 caveat for acuity of condition.  Accidental trip and fall on the sidewalk striking the left proximal humerus just prior to ED visit..  No head or neck injury.  Patient was given intravenous fentanyl in route via EMS.  No other obvious injuries.  Past medical history includes obesity and gastric Roux-en-Y procedure.     History reviewed. No pertinent past medical history.  There are no active problems to display for this patient.   Past Surgical History:  Procedure Laterality Date  . CHOLECYSTECTOMY    . GA[facility]     [facility]   . TONSILLECTOMY  1994 or 95     OB History   None      Home Medications    Prior to Admission medications   Medication Sig Start Date End Date Taking? Authorizing Provider  Brexpiprazole (REXULTI) 1 MG TABS Take 1 tablet by mouth daily.   Yes [provider]  ciprofloxacin (CIPRO) 500 MG tablet Take 500 mg by mouth 2 (two) times daily.   Yes [provider]  escitalopram (LEXAPRO) 10 MG tablet Take 10 mg by mouth daily.   Yes [provider]  HYDROcodone-acetaminophen (NORCO/VICODIN) 5-325 MG tablet Take 1 tablet by mouth every 6 (six) hours as needed for moderate pain or severe pain.   Yes [provider]  lamoTRIgine (LAMICTAL) 100 MG tablet Take 100 mg by mouth daily.   Yes [provider]  norgestimate-ethinyl estradiol (SPRINTEC 28) 0.25-35 MG-MCG tablet Take 1 tablet by mouth daily.   Yes [provider]  pantoprazole (PROTONIX) 40 MG tablet Take 40 mg by mouth daily.   Yes [provider]  amoxicillin-clavulanate (AUGMENTIN) 875-125 MG per tablet Take 1 tablet by mouth 2 (two) times  daily. Patient not taking: Reported on 04/18/2018 12/09/13   Elvina SidleLauenstein, Kurt, MD  ondansetron (ZOFRAN) 4 MG tablet Take 1 tablet (4 mg total) by mouth every 6 (six) hours. 04/18/18   Donnetta Hutchingook, Oluwatoyin Banales, MD  oxyCODONE-acetaminophen (PERCOCET) 5-325 MG tablet Take 1-2 tablets by mouth every 4 (four) hours as needed. 04/18/18   Donnetta Hutchingook, Lilla Callejo, MD    Family History Family History  Problem Relation Age of Onset  . Diabetes Mother   . Mental illness Mother   . Hyperlipidemia Father   . Hypertension Father   . Diabetes Maternal Grandfather     Social History Social History   Tobacco Use  . Smoking status: Never Smoker  . Smokeless tobacco: Never Used  Substance Use Topics  . Alcohol use: No  . Drug use: No     Allergies   Augmentin [amoxicillin-pot clavulanate]   Review of Systems Review of Systems  Unable to perform ROS: Acuity of condition     Physical Exam Updated Vital Signs BP (!) 156/103 (BP Location: Right Arm)   Pulse 75   Temp (!) 97.5 F (36.4 C) (Oral)   Resp 15   Ht 5\' 9"  (1.753 m)   Wt (!) 138.3 kg   LMP 03/28/2018   SpO2 96%   BMI 45.04 kg/m   Physical Exam  Constitutional: She is oriented to person, place, and time. She appears well-developed and well-nourished.  HENT:  Head: Normocephalic and atraumatic.  Eyes: Conjunctivae are normal.  Neck: Neck supple.  Cardiovascular: Normal rate and regular rhythm.  Pulmonary/Chest: Effort normal and breath sounds normal.  Abdominal: Soft. Bowel sounds are normal.  Musculoskeletal:  Left upper extremity: Tender proximal humerus.  Pain with range of motion.  Neurological: She is alert and oriented to person, place, and time.  Skin: Skin is warm and dry.  Psychiatric: She has a normal mood and affect. Her behavior is normal.  Nursing note and vitals reviewed.    ED Treatments / Results  Labs (all labs ordered are listed, but only abnormal results are displayed) Labs Reviewed - No data to  display  EKG None  Radiology Ct Shoulder Left Wo Contrast  Result Date: 04/18/2018 CLINICAL DATA:  Patient tripped on sidewalk landing on left arm. EXAM: CT OF THE UPPER LEFT EXTREMITY WITHOUT CONTRAST TECHNIQUE: Multidetector CT imaging of the upper left extremity was performed according to the standard protocol. COMPARISON:  Same day radiographs FINDINGS: Bones/Joint/Cartilage There is an acute, closed, comminuted fracture of the proximal left humerus involving the surgical neck and humeral head. The humeral head fracture undermines the greater tuberosity and there is impaction of the humeral head along the posterior glenoid rim, series 4/33. Approximately 26 degrees of varus angulation is noted of the humeral head relative to the humeral shaft on the oblique view. No glenoid fracture. The acromioclavicular joint is maintained. The included cervical spine and ribs are intact. No scapular fracture. Ligaments Suboptimally assessed by CT. Muscles and Tendons No intramuscular hemorrhage or atrophy.  Small joint effusion. Soft tissues Negative IMPRESSION: Acute, closed, comminuted fracture of the proximal left humerus involving the surgical neck with comminuted impacted fracture of the humeral head on the glenoid. No significant displacement greater than 1 cm or angulation greater than 45 degrees is identified of the proximal humeral fracture. Electronically Signed   By: Tollie Eth M.D.   On: 04/18/2018 22:59   Dg Shoulder Left  Result Date: 04/18/2018 CLINICAL DATA:  Pain after fall EXAM: LEFT SHOULDER - 2+ VIEW COMPARISON:  None. FINDINGS: The transscapular Y-view is limited limiting evaluation for dislocation. No definitive dislocation noted. Comminuted fracture of the proximal humerus extending through the humeral neck and possibly into the humeral head. There may be a step-off at the superior aspect of the humerus on the transscapular Y-view. IMPRESSION: 1. There is a fracture through the proximal  humerus. I am suspicious that the fracture extends into the humeral head and there may be a step-off at the superior most extent of the humeral head on the limited transscapular Y-view. 2. Evaluation for dislocation is limited due to positioning of the transscapular Y-view. There is a definitely not an anterior dislocation. I do not believe there is a posterior dislocation based on the limited transscapular Y-view. Electronically Signed   By: Gerome Sam III M.D   On: 04/18/2018 19:12   Dg Humerus Left  Result Date: 04/18/2018 CLINICAL DATA:  Pain after fall EXAM: LEFT HUMERUS - 2+ VIEW COMPARISON:  None. FINDINGS: There is a comminuted mildly displaced fracture through the proximal humerus. IMPRESSION: Comminuted mildly displaced fracture through the proximal left humerus. Electronically Signed   By: Gerome Sam III M.D   On: 04/18/2018 19:09    Procedures Procedures (including critical care time)  Medications Ordered in ED Medications  sodium chloride 0.9 % bolus 500 mL (0 mLs Intravenous Stopped 04/18/18 2049)  ondansetron (ZOFRAN) injection 4 mg (4 mg Intravenous Given  04/18/18 1950)  morphine 4 MG/ML injection 4 mg (4 mg Intravenous Given 04/18/18 1951)  morphine 4 MG/ML injection 4 mg (4 mg Intravenous Given 04/18/18 2142)  ondansetron (ZOFRAN) injection 4 mg (4 mg Intravenous Given 04/18/18 2140)  morphine 4 MG/ML injection 4 mg (4 mg Intravenous Given 04/18/18 2305)     Initial Impression / Assessment and Plan / ED Course  I have reviewed the triage vital signs and the nursing notes.  Pertinent labs & imaging results that were available during my care of the patient were reviewed by me and considered in my medical decision making (see chart for details).     Status post accidental fall striking left proximal humerus.  CT reveals comminuted fracture of the surgical neck and humeral head without significant displacement.  Discussed with Dr. Charlann Boxer.  Will sling patient, treat pain,  refer to orthopedics this week.  Charge medication Percocet and Zofran 4 mg.  Discussed findings with the patient and her father.  Final Clinical Impressions(s) / ED Diagnoses   Final diagnoses:  Closed fracture of proximal end of left humerus, unspecified fracture morphology, initial encounter    ED Discharge Orders         Ordered    oxyCODONE-acetaminophen (PERCOCET) 5-325 MG tablet  Every 4 hours PRN     04/18/18 2331    ondansetron (ZOFRAN) 4 MG tablet  Every 6 hours     04/18/18 2331           Donnetta Hutching, MD 04/19/18 1527

## 2019-06-23 IMAGING — CR DG SHOULDER 2+V*L*
2 series · 2 of 2 positions shown · non-contrast
Comparison: None.

CLINICAL DATA: Pain after fall

EXAM:
LEFT SHOULDER - 2+ VIEW

[x shoulder ap left (1 of 2)]
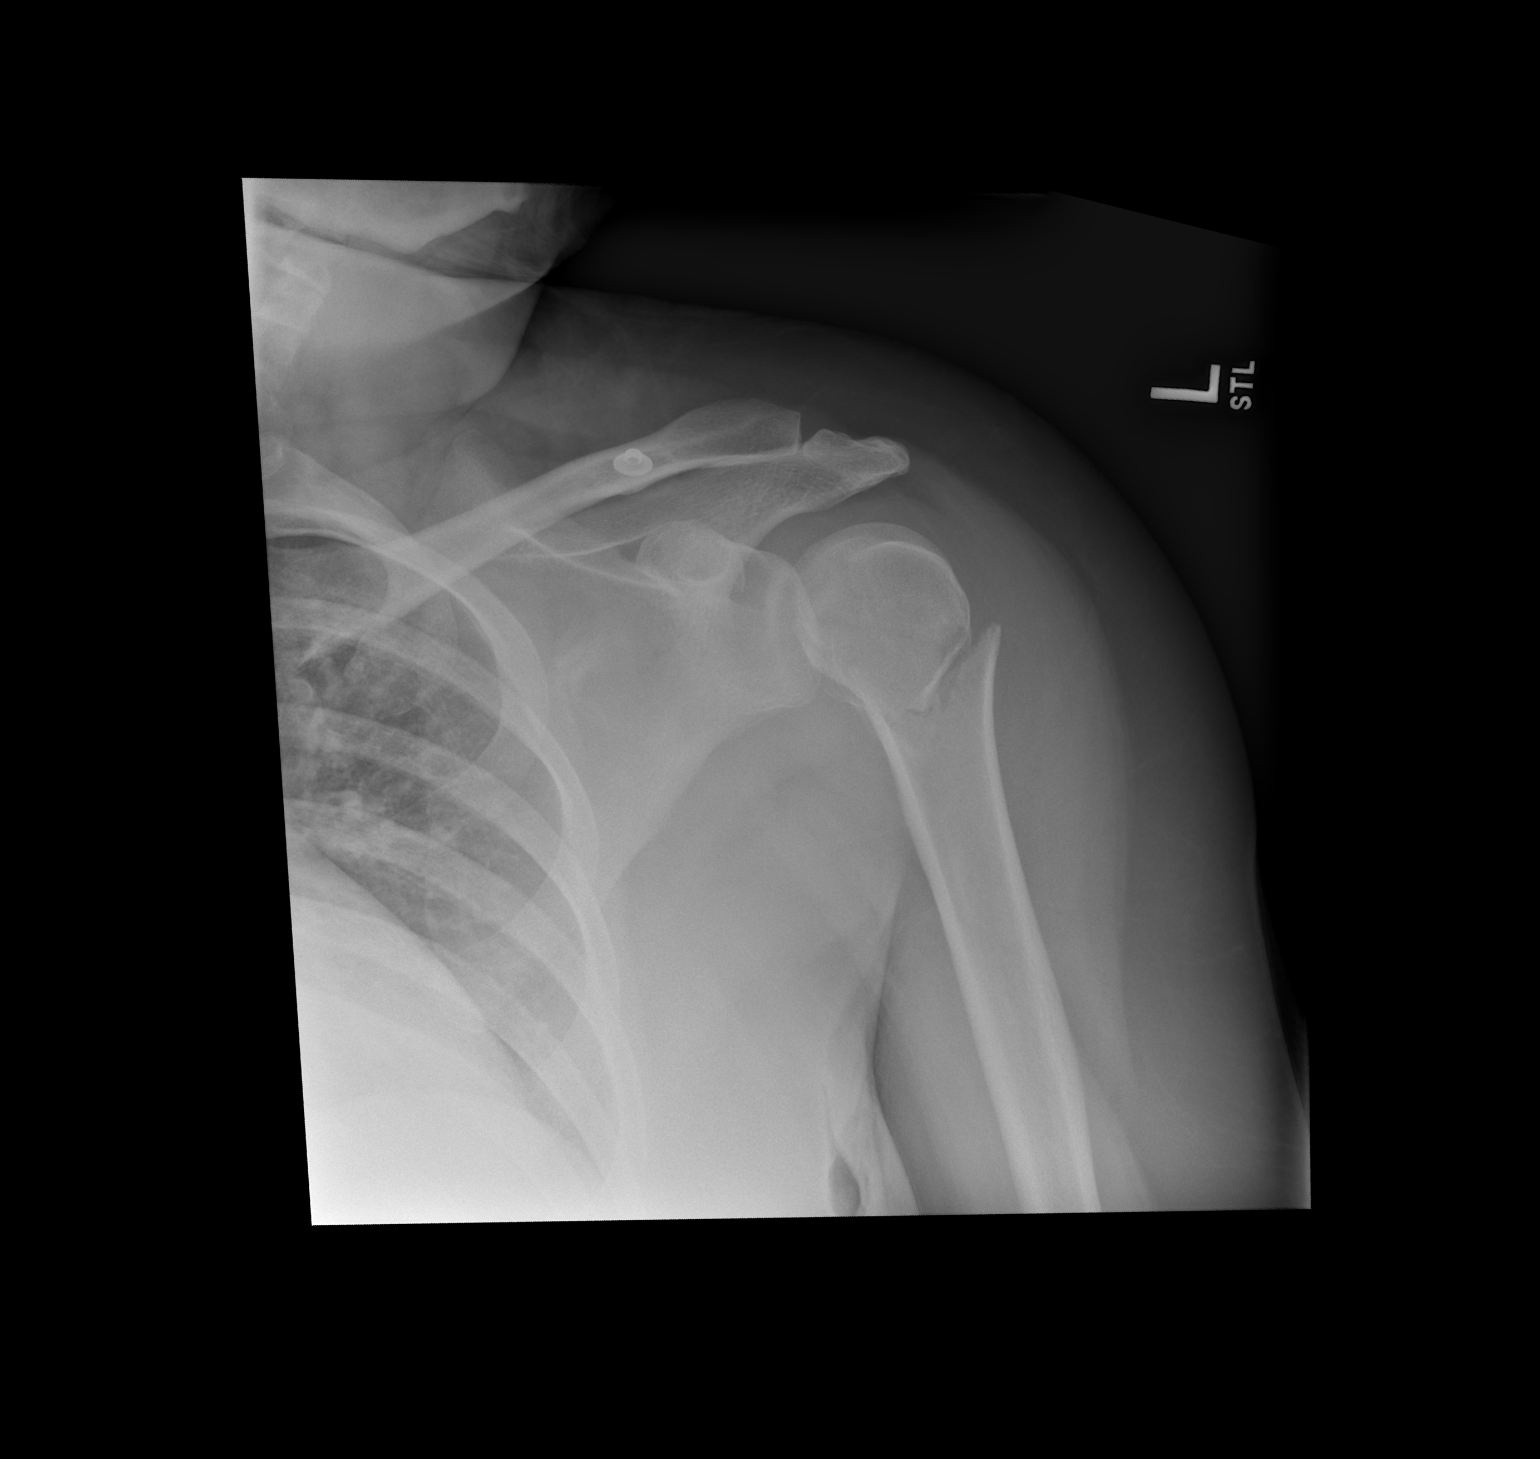

[x shoulder ap left (2 of 2)]
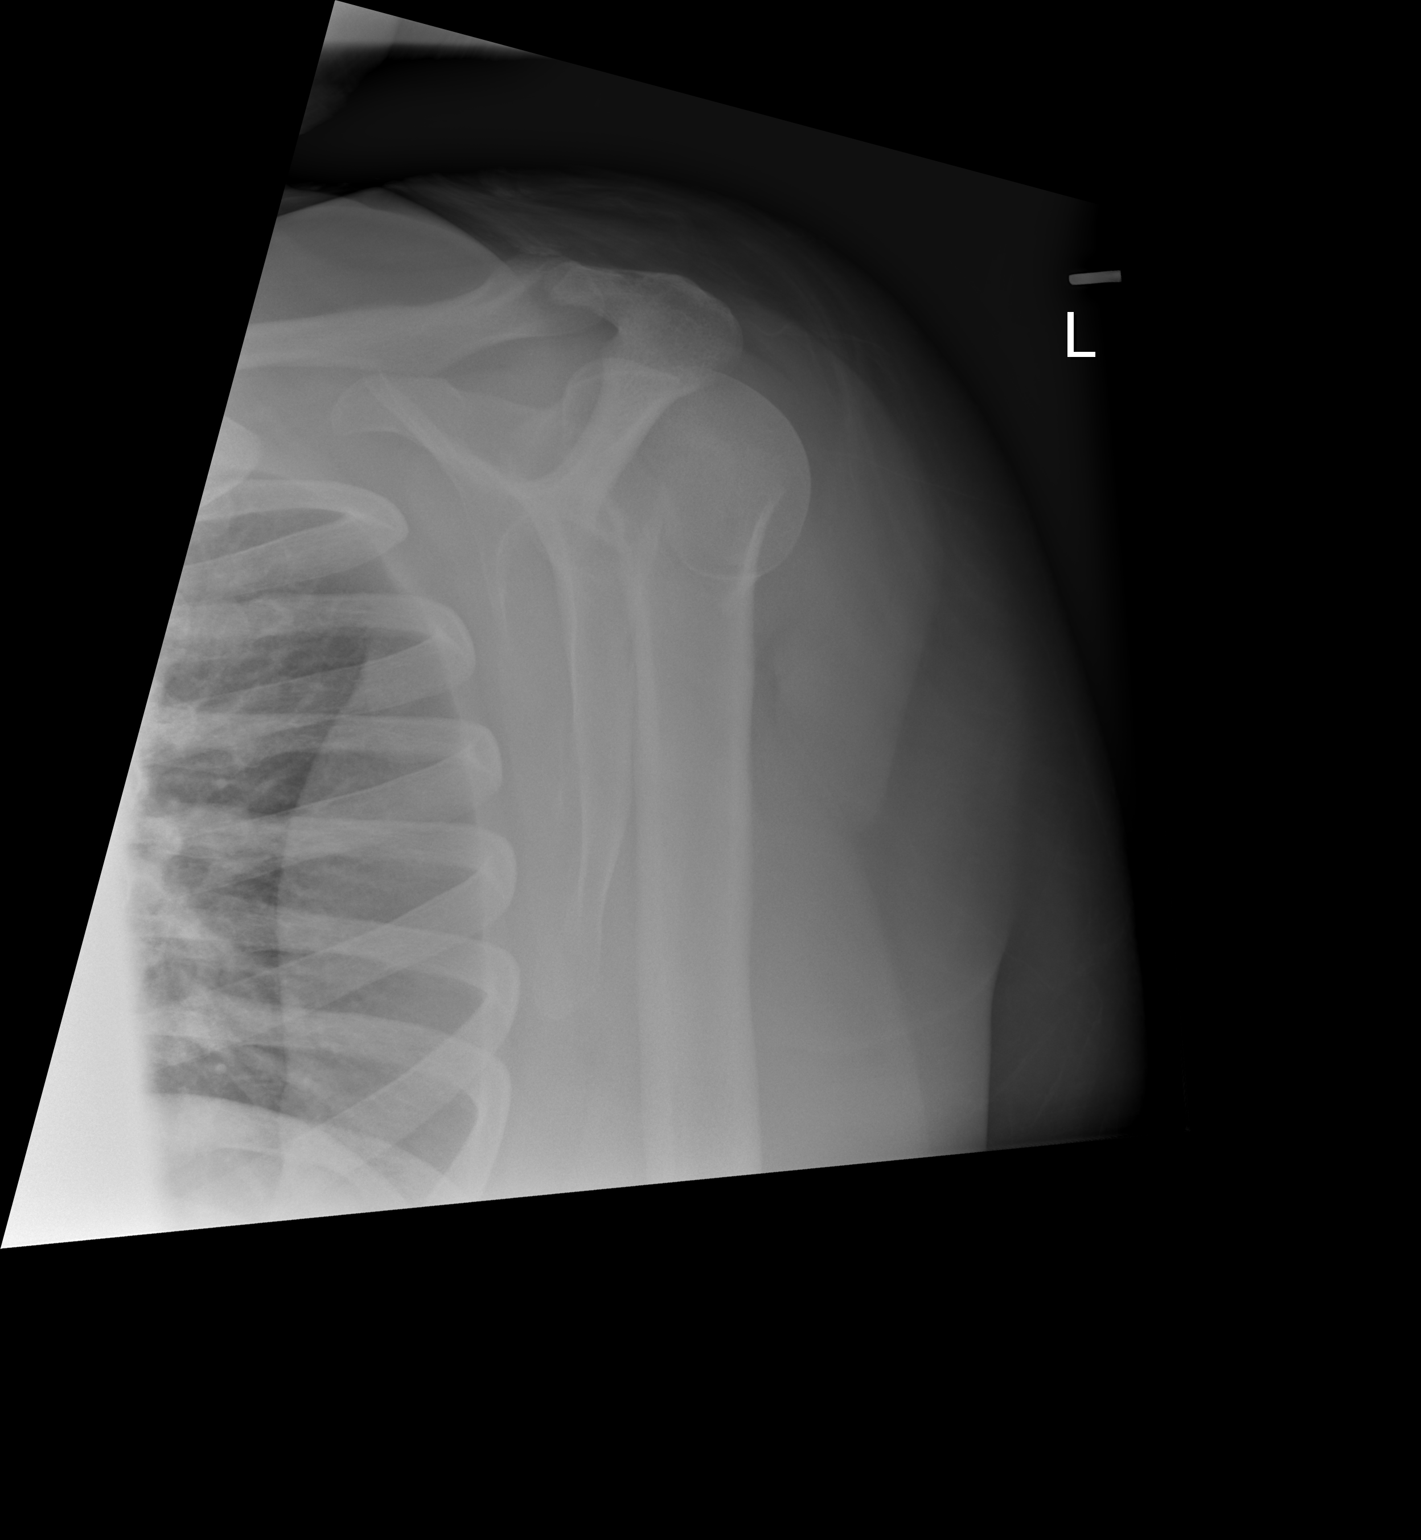

[2 of 2 positions shown; findings below may reference images not displayed]

FINDINGS: The transscapular Y-view is limited limiting evaluation for
dislocation. No definitive dislocation noted. Comminuted fracture of
the proximal humerus extending through the humeral neck and possibly
into the humeral head. There may be a step-off at the superior
aspect of the humerus on the transscapular Y-view.
IMPRESSION: 1. There is a fracture through the proximal humerus. I am suspicious
that the fracture extends into the humeral head and there may be a
step-off at the superior most extent of the humeral head on the
limited transscapular Y-view.
2. Evaluation for dislocation is limited due to positioning of the
transscapular Y-view. There is a definitely not an anterior
dislocation. I do not believe there is a posterior dislocation based
on the limited transscapular Y-view.

## 2020-05-16 ENCOUNTER — Ambulatory Visit: Payer: BC Managed Care – PPO | Attending: Internal Medicine

## 2020-05-16 DIAGNOSIS — Z23 Encounter for immunization: Secondary | ICD-10-CM

## 2020-05-16 NOTE — Progress Notes (Signed)
   Covid-19 Vaccination Clinic  Name:  Alicia Lamb    MRN: [id number] DOB: [birth year]  05/16/2020  [name] was observed post Covid-19 immunization for 15 minutes without incident. She was provided with Vaccine Information Sheet and instruction to access the V-Safe system.   [name] was instructed to call 911 with any severe reactions post vaccine: Marland Kitchen Difficulty breathing  . Swelling of face and throat  . A fast heartbeat  . A bad rash all over body  . Dizziness and weakness   Immunizations Administered    Name Date Dose VIS Date Route   JANSSEN COVID-19 VACCINE 05/16/2020  2:15 PM 0.5 mL 03/06/2020 Intramuscular   Manufacturer: [employer]   Lot: [id number]   NDC: 33007-622-63

## 2023-12-22 ENCOUNTER — Other Ambulatory Visit: Payer: Self-pay | Admitting: Medical Genetics

## 2024-01-12 ENCOUNTER — Other Ambulatory Visit (HOSPITAL_COMMUNITY)
Admission: RE | Admit: 2024-01-12 | Discharge: 2024-01-12 | Disposition: A | Discharge status: Home / Self Care | Payer: Self-pay | Source: Ambulatory Visit | Attending: Medical Genetics | Admitting: Medical Genetics

## 2024-01-21 LAB — GENECONNECT MOLECULAR SCREEN: Genetic Analysis Overall Interpretation: NEGATIVE
